# Patient Record
Sex: Male | Born: 1985
Health system: Southern US, Community
[De-identification: ages and names within clinical notes are randomized; demographics above are authoritative.]

## PROBLEM LIST (undated history)

## (undated) HISTORY — PX: KIDNEY SURGERY: SHX687

---

## 2007-01-30 ENCOUNTER — Emergency Department (HOSPITAL_COMMUNITY): Admission: EM | Admit: 2007-01-30 | Discharge: 2007-01-31 | Payer: Self-pay | Admitting: Emergency Medicine

## 2016-01-06 DIAGNOSIS — R05 Cough: Secondary | ICD-10-CM | POA: Diagnosis not present

## 2016-01-06 DIAGNOSIS — R509 Fever, unspecified: Secondary | ICD-10-CM | POA: Diagnosis not present

## 2017-03-03 DIAGNOSIS — J069 Acute upper respiratory infection, unspecified: Secondary | ICD-10-CM | POA: Diagnosis not present

## 2019-07-07 DIAGNOSIS — M25572 Pain in left ankle and joints of left foot: Secondary | ICD-10-CM | POA: Diagnosis not present

## 2020-10-14 DIAGNOSIS — J069 Acute upper respiratory infection, unspecified: Secondary | ICD-10-CM | POA: Diagnosis not present

## 2021-10-21 ENCOUNTER — Emergency Department (HOSPITAL_BASED_OUTPATIENT_CLINIC_OR_DEPARTMENT_OTHER): Payer: BC Managed Care – PPO | Admitting: Radiology

## 2021-10-21 ENCOUNTER — Emergency Department (HOSPITAL_BASED_OUTPATIENT_CLINIC_OR_DEPARTMENT_OTHER)
Admission: EM | Admit: 2021-10-21 | Discharge: 2021-10-21 | Disposition: A | Discharge status: Home / Self Care | Payer: BC Managed Care – PPO | Attending: Emergency Medicine | Admitting: Emergency Medicine

## 2021-10-21 ENCOUNTER — Encounter (HOSPITAL_BASED_OUTPATIENT_CLINIC_OR_DEPARTMENT_OTHER): Payer: Self-pay

## 2021-10-21 ENCOUNTER — Other Ambulatory Visit: Payer: Self-pay

## 2021-10-21 DIAGNOSIS — X58XXXA Exposure to other specified factors, initial encounter: Secondary | ICD-10-CM | POA: Diagnosis not present

## 2021-10-21 DIAGNOSIS — T18128A Food in esophagus causing other injury, initial encounter: Secondary | ICD-10-CM | POA: Insufficient documentation

## 2021-10-21 DIAGNOSIS — Z0389 Encounter for observation for other suspected diseases and conditions ruled out: Secondary | ICD-10-CM | POA: Diagnosis not present

## 2021-10-21 NOTE — ED Notes (Signed)
Pt discharged to home. Discharge instructions have been discussed with patient and/or family members. Pt verbally acknowledges understanding d/c instructions, and endorses comprehension to checkout at registration before leaving.  °

## 2021-10-21 NOTE — Discharge Instructions (Signed)
Call gastroenterology information above for follow-up for the food getting stuck.  Most likely needs an upper endoscopy.  Return if food gets stuck again and will not go down.

## 2021-10-21 NOTE — ED Provider Notes (Signed)
[facility] EMERGENCY DEPT Provider Note   CSN: [id number] Arrival date & time: 10/21/21  1151     History Chief Complaint  Patient presents with   Aspiration    Allen Perkins is a 35 y.o. male.  Was eating food and at about 930 things got stuck.  He can get things to go down.  Was having difficulty swallowing.  Patient then went on to vomit things up and food came up.  Has been able to drink some water after vomiting was not able to do that before the water would not go down.  Patient without any breathing problems.  Patient talking in complete sentences.  Patient states that this is happened in the past but is always gone down.  This was the most severe.  Patient has not been seen by gastroenterology.  Has not been scoped in the past.       History reviewed. No pertinent past medical history.  There are no problems to display for this patient.   Past Surgical History:  Procedure Laterality Date   KIDNEY SURGERY         History reviewed. No pertinent family history.  Social History   Tobacco Use   Smoking status: Never   Smokeless tobacco: Never  Substance Use Topics   Alcohol use: Never   Drug use: Never    Home Medications Prior to Admission medications   Not on File    Allergies    Amoxicillin  Review of Systems   Review of Systems  Constitutional:  Negative for chills and fever.  HENT:  Positive for trouble swallowing. Negative for ear pain and sore throat.   Eyes:  Negative for pain and visual disturbance.  Respiratory:  Negative for cough and shortness of breath.   Cardiovascular:  Negative for chest pain and palpitations.  Gastrointestinal:  Negative for abdominal pain and vomiting.  Genitourinary:  Negative for dysuria and hematuria.  Musculoskeletal:  Negative for arthralgias and back pain.  Skin:  Negative for color change and rash.  Neurological:  Negative for seizures and syncope.  All other systems reviewed and are  negative.  Physical Exam Updated Vital Signs BP (!) 167/110 (BP Location: Right Arm)   Pulse (!) 101   Temp 98.1 F (36.7 C)   Resp 18   Ht 1.829 m (6')   Wt 129.3 kg   SpO2 98%   BMI 38.65 kg/m   Physical Exam Vitals and nursing note reviewed.  Constitutional:      General: He is not in acute distress.    Appearance: Normal appearance. He is well-developed.  HENT:     Head: Normocephalic and atraumatic.  Eyes:     Conjunctiva/sclera: Conjunctivae normal.  Cardiovascular:     Rate and Rhythm: Normal rate and regular rhythm.     Heart sounds: No murmur heard. Pulmonary:     Effort: Pulmonary effort is normal. No respiratory distress.     Breath sounds: Normal breath sounds. No wheezing or rales.  Abdominal:     Palpations: Abdomen is soft.     Tenderness: There is no abdominal tenderness.  Musculoskeletal:     Cervical back: Neck supple.  Skin:    General: Skin is warm and dry.  Neurological:     General: No focal deficit present.     Mental Status: He is alert and oriented to person, place, and time.    ED Results / Procedures / Treatments   Labs (all labs ordered are  listed, but only abnormal results are displayed) Labs Reviewed - No data to display  EKG None  Radiology DG Chest 2 View  Result Date: 10/21/2021 CLINICAL DATA:  Provided history: Rule out aspiration. EXAM: CHEST - 2 VIEW COMPARISON:  Chest radiographs 01/06/2016. FINDINGS: Heart size within normal limits. No appreciable airspace consolidation. No evidence of pleural effusion or pneumothorax. No acute bony abnormality identified. Degenerative changes of the spine. IMPRESSION: No evidence of acute cardiopulmonary abnormality. Electronically Signed   By: Jackey Loge D.O.   On: 10/21/2021 13:08    Procedures Procedures   Medications Ordered in ED Medications - No data to display  ED Course  I have reviewed the triage vital signs and the nursing notes.  Pertinent labs & imaging results that  were available during my care of the patient were reviewed by me and considered in my medical decision making (see chart for details).    MDM Rules/Calculators/A&P                           Chest x-ray negative.  Sounds as if patient had a food impaction.  Seems as if maybe it had passed.  We will give him challenge with soft drink and then some crackers.  If he passes that he will be okay for discharge home.  We will give him follow-up with gastroenterology on-call.  Patient tolerating soda and food without any difficulty.  Patient stable for discharge home follow-up with gastroenterology.  Patient will return if food gets stuck again. Final Clinical Impression(s) / ED Diagnoses Final diagnoses:  Food impaction of esophagus, initial encounter    Rx / DC Orders ED Discharge Orders     None        Vanetta Mulders, MD 10/21/21 1452

## 2021-10-21 NOTE — ED Triage Notes (Signed)
Pt reports he got chocked up on his food this am, he was able to cough most of it up but having difficulty swallowing. Pt reports he vomited up more food upon arrival to ED, able to drink some water after vomiting, unable to do so before. Pt speaking complete sentences, voice is a little husky

## 2022-06-29 IMAGING — DX DG CHEST 2V
2 series · 2 of 2 positions shown · non-contrast
Comparison: Chest radiographs 01/06/2016.

CLINICAL DATA: Provided history: Rule out aspiration.

EXAM:
CHEST - 2 VIEW

[chest pa]
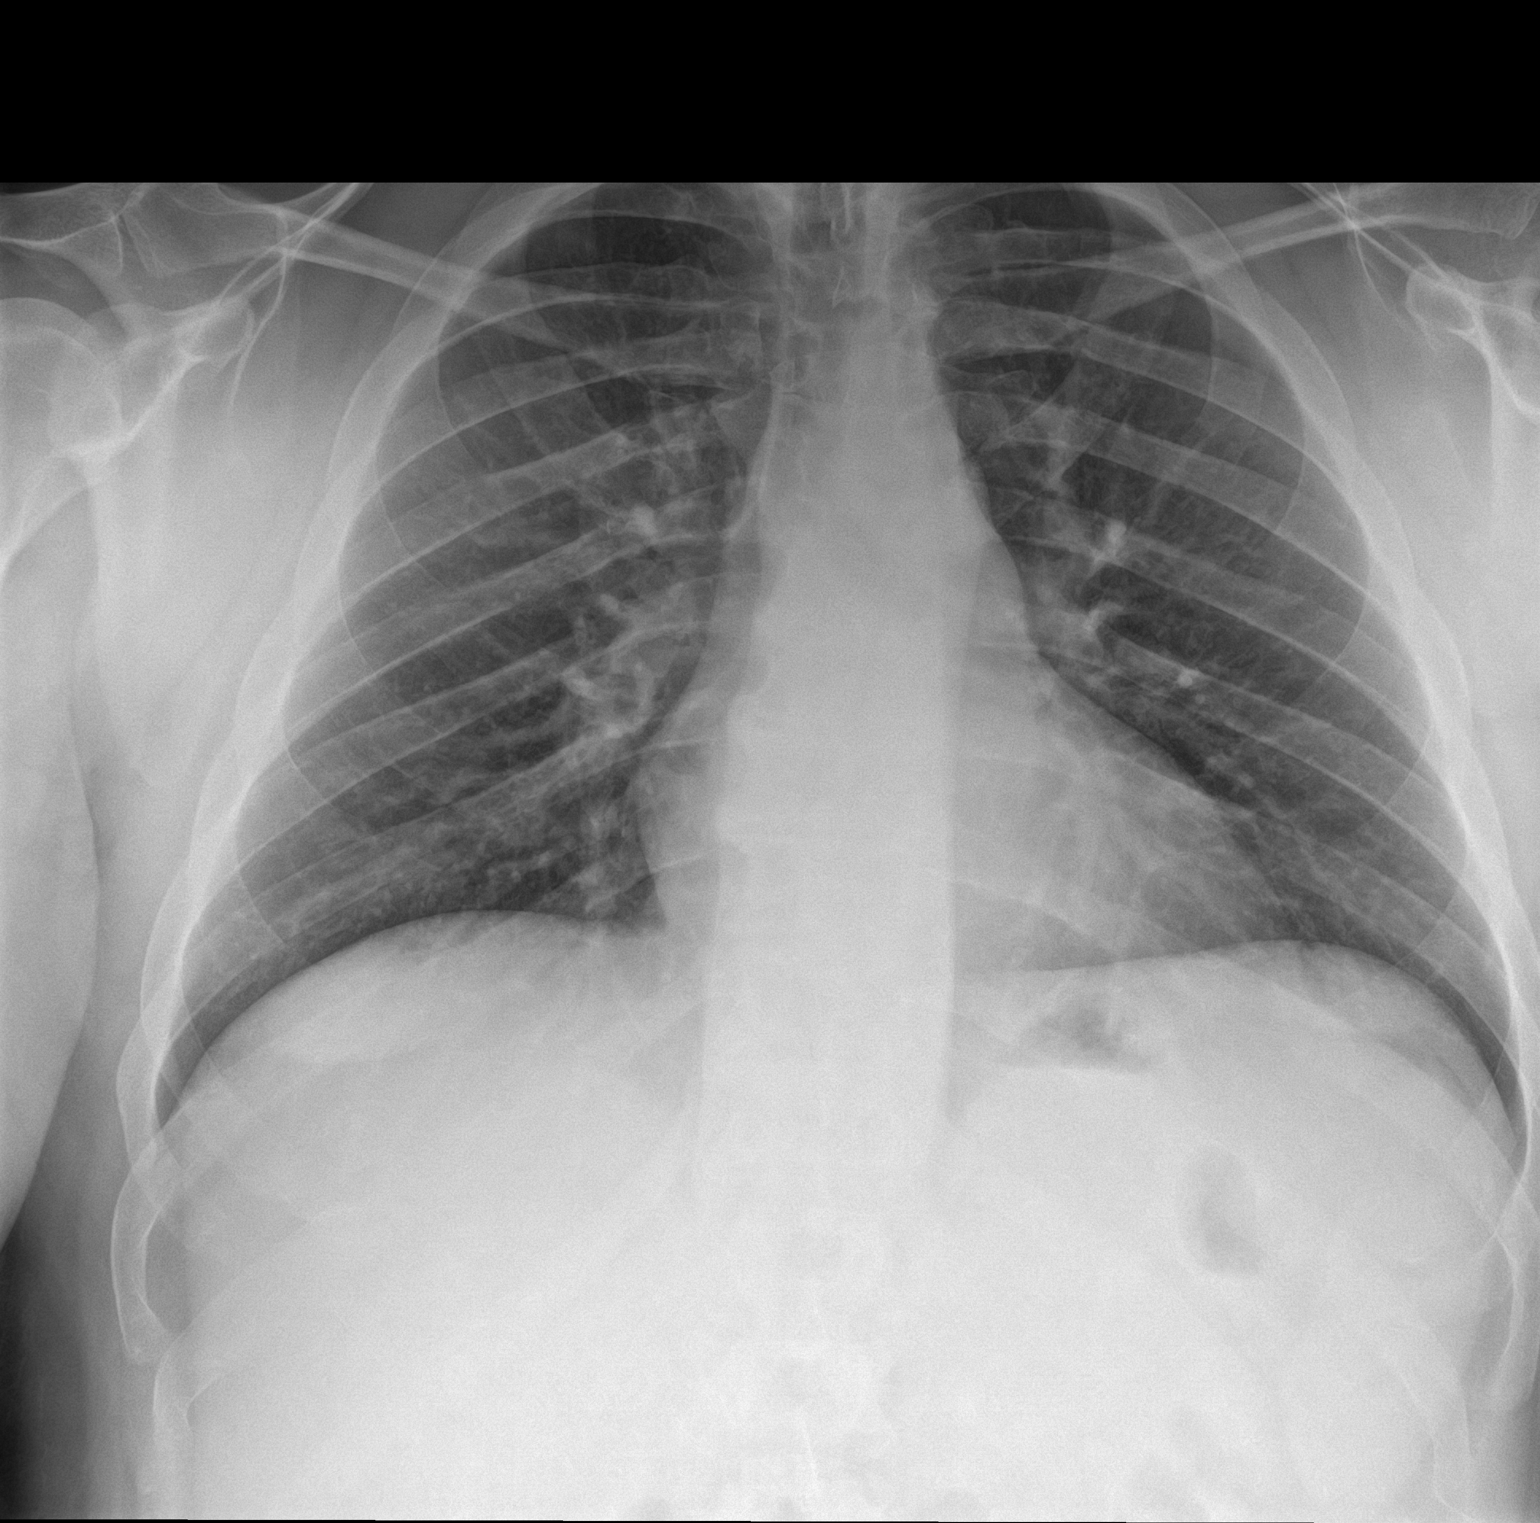

[chest lat]
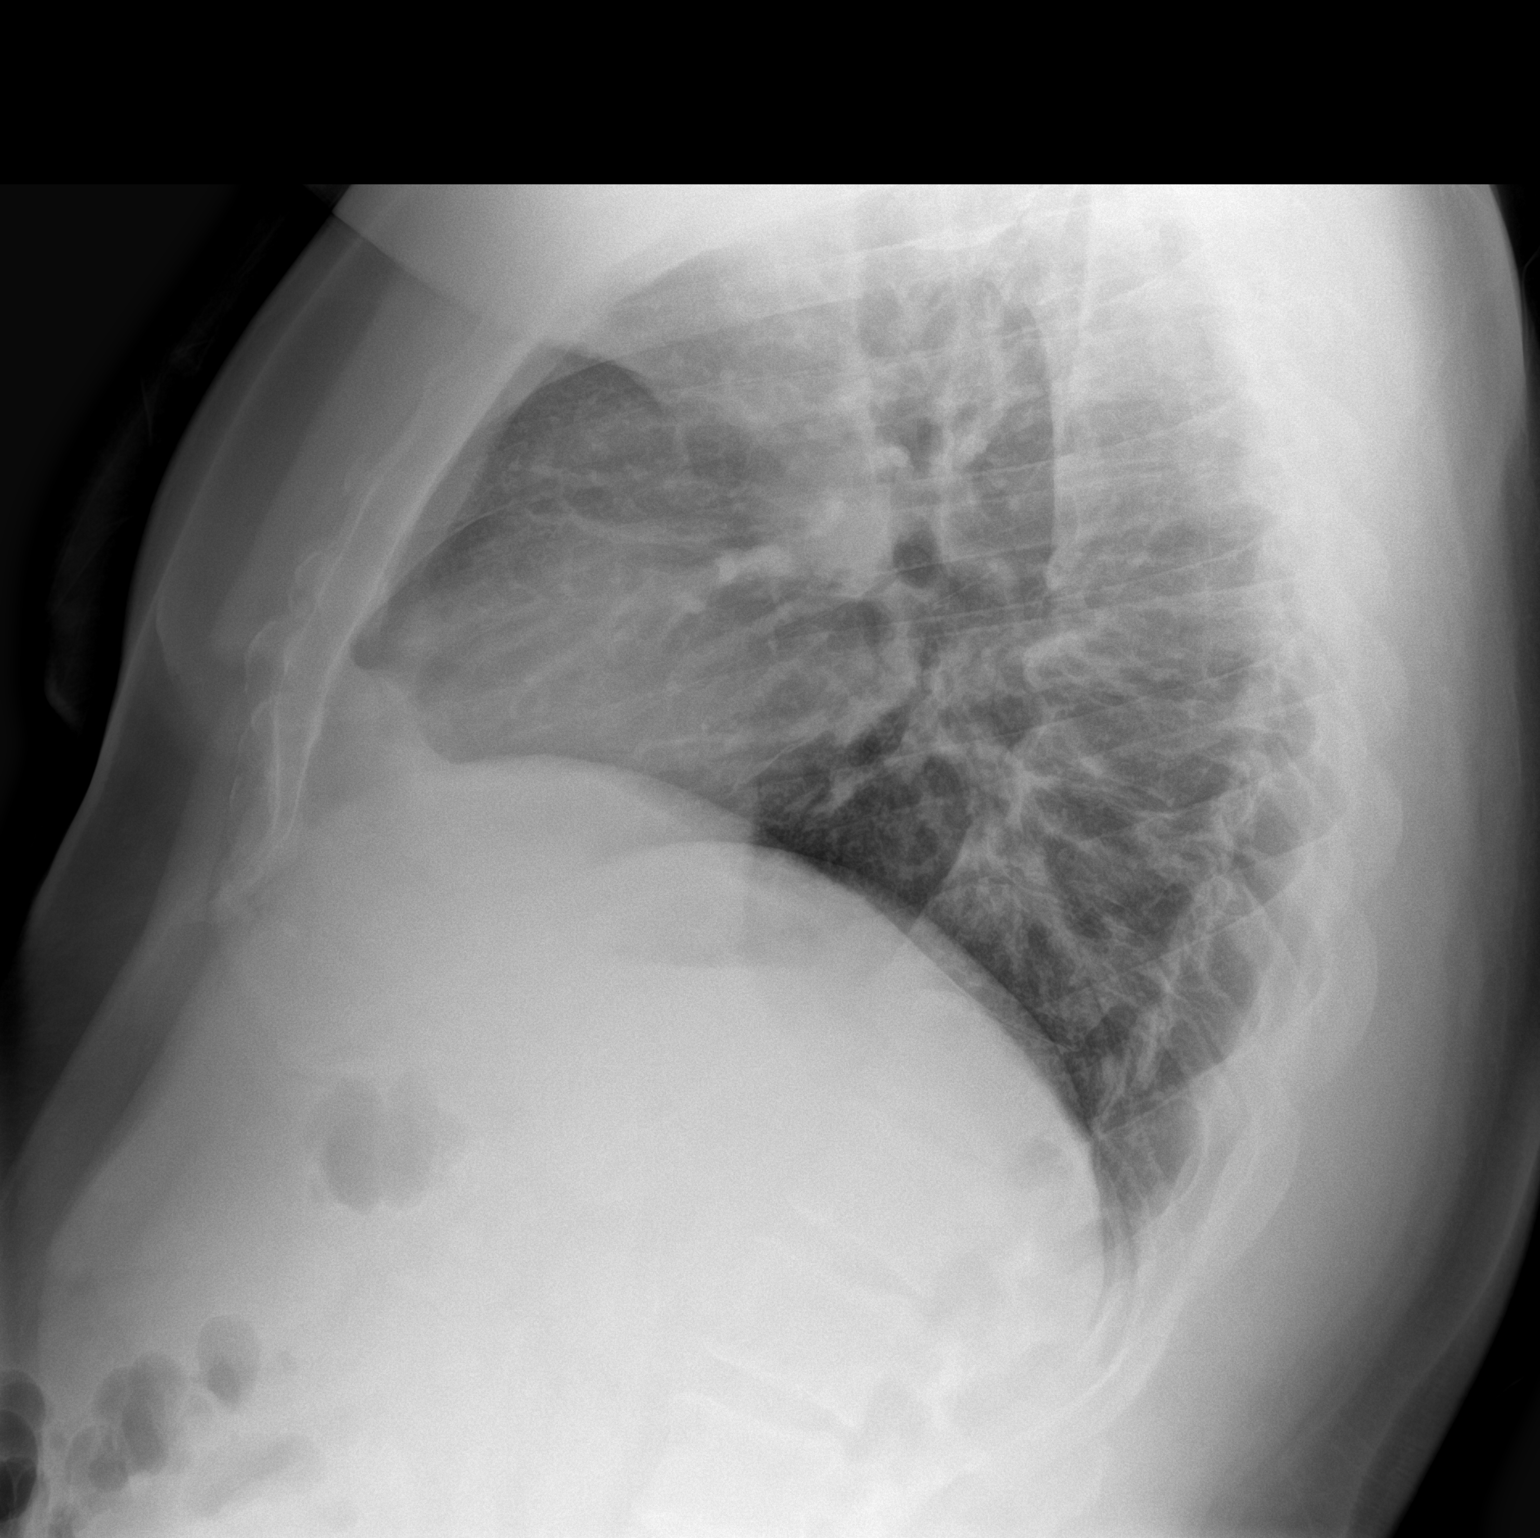

[2 of 2 positions shown; findings below may reference images not displayed]

FINDINGS: Heart size within normal limits. No appreciable airspace
consolidation. No evidence of pleural effusion or pneumothorax. No
acute bony abnormality identified. Degenerative changes of the
spine.
IMPRESSION: No evidence of acute cardiopulmonary abnormality.
